# Patient Record
Sex: Female | Born: 2000
Health system: Southern US, Community
[De-identification: ages and names within clinical notes are randomized; demographics above are authoritative.]

## PROBLEM LIST (undated history)

## (undated) HISTORY — PX: NO PAST SURGERIES: SHX2092

---
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
## 2020-01-29 ENCOUNTER — Telehealth: Payer: Self-pay | Admitting: Family Medicine

## 2020-01-29 NOTE — Telephone Encounter (Signed)
Pt had Medical Records CD at front and never picked up  I mailed it to home address

## 2020-02-03 MED FILL — DROSPIRENONE-EE 3-0.02 MG T: 3-0.02 | 84 days supply | Qty: 84 | Fill #1

## 2020-04-14 ENCOUNTER — Ambulatory Visit (HOSPITAL_BASED_OUTPATIENT_CLINIC_OR_DEPARTMENT_OTHER)
Admission: RE | Admit: 2020-04-14 | Discharge: 2020-04-14 | Disposition: A | Discharge status: Home / Self Care | Payer: Managed Care, Other (non HMO) | Source: Ambulatory Visit | Attending: Medical | Admitting: Medical

## 2020-04-14 ENCOUNTER — Encounter: Payer: Self-pay | Admitting: Medical

## 2020-04-14 ENCOUNTER — Other Ambulatory Visit: Payer: Self-pay

## 2020-04-14 ENCOUNTER — Ambulatory Visit: Payer: Managed Care, Other (non HMO) | Admitting: Medical

## 2020-04-14 VITALS — BP 133/71 | HR 90 | Ht 64.0 in | Wt 159.0 lb

## 2020-04-14 DIAGNOSIS — R1032 Left lower quadrant pain: Secondary | ICD-10-CM | POA: Diagnosis not present

## 2020-04-14 DIAGNOSIS — K5900 Constipation, unspecified: Secondary | ICD-10-CM | POA: Diagnosis not present

## 2020-04-14 DIAGNOSIS — R112 Nausea with vomiting, unspecified: Secondary | ICD-10-CM | POA: Diagnosis not present

## 2020-04-14 LAB — CBC WITH DIFFERENTIAL/PLATELET
Basophils Absolute: 0 10*3/uL (ref 0.0–0.1)
Basophils Relative: 0.4 % (ref 0.0–3.0)
Eosinophils Absolute: 0.1 10*3/uL (ref 0.0–0.7)
Eosinophils Relative: 0.9 % (ref 0.0–5.0)
HCT: 38.8 % (ref 36.0–49.0)
Hemoglobin: 12.9 g/dL (ref 12.0–16.0)
Lymphocytes Relative: 37.3 % (ref 24.0–48.0)
Lymphs Abs: 2.4 10*3/uL (ref 0.7–4.0)
MCHC: 33.4 g/dL (ref 31.0–37.0)
MCV: 80.2 fl (ref 78.0–98.0)
Monocytes Absolute: 0.5 10*3/uL (ref 0.1–1.0)
Monocytes Relative: 7.6 % (ref 3.0–12.0)
Neutro Abs: 3.5 10*3/uL (ref 1.4–7.7)
Neutrophils Relative %: 53.8 % (ref 43.0–71.0)
Platelets: 382 10*3/uL (ref 150.0–575.0)
RBC: 4.83 Mil/uL (ref 3.80–5.70)
RDW: 13.4 % (ref 11.4–15.5)
WBC: 6.5 10*3/uL (ref 4.5–13.5)

## 2020-04-14 LAB — POC URINALSYSI DIPSTICK (AUTOMATED)
Bilirubin, UA: NEGATIVE
Blood, UA: POSITIVE
Glucose, UA: NEGATIVE
Ketones, UA: NEGATIVE
Leukocytes, UA: NEGATIVE
Nitrite, UA: POSITIVE
Protein, UA: NEGATIVE
Spec Grav, UA: 1.025 (ref 1.010–1.025)
Urobilinogen, UA: 0.2 E.U./dL
pH, UA: 6 (ref 5.0–8.0)

## 2020-04-14 LAB — COMPREHENSIVE METABOLIC PANEL
ALT: 9 U/L (ref 0–35)
AST: 12 U/L (ref 0–37)
Albumin: 4.2 g/dL (ref 3.5–5.2)
Alkaline Phosphatase: 82 U/L (ref 47–119)
BUN: 12 mg/dL (ref 6–23)
CO2: 27 mEq/L (ref 19–32)
Calcium: 9.6 mg/dL (ref 8.4–10.5)
Chloride: 103 mEq/L (ref 96–112)
Creatinine, Ser: 0.76 mg/dL (ref 0.40–1.20)
GFR: 113.21 mL/min (ref >60.00)
Glucose, Bld: 86 mg/dL (ref 70–99)
Potassium: 4.5 mEq/L (ref 3.5–5.1)
Sodium: 137 mEq/L (ref 135–145)
Total Bilirubin: 0.2 mg/dL (ref 0.2–1.2)
Total Protein: 7.4 g/dL (ref 6.0–8.3)

## 2020-04-14 LAB — POCT URINE PREGNANCY: Preg Test, Ur: NEGATIVE

## 2020-04-14 LAB — LIPASE: Lipase: 11 U/L (ref 11.0–59.0)

## 2020-04-14 MED ORDER — ONDANSETRON 4 MG PO TBDP: 4.0000 mg | ORAL_TABLET | Freq: Three times a day (TID) | ORAL | 0 refills | Status: DC | PRN

## 2020-04-14 NOTE — Patient Instructions (Addendum)
You have hx of constipation pattern intermixed with loose stools. Recent llq area pain. We got ua today with pregnancy test.(result was negative.) Will also get cbc, cmp, lipase and abdomen xray.  Presently recommend hydrate well, daily mild exercise if not worsening your pain, use metamucil 1 tablespoon in 8 oz of water three times daily.   If xray shows large stool burden left side colon area may advise dulcolaxl, miralax or mag citrate.   Rx zofran if you get nausea.  Will need to follow labs and if initial work up not revealing cause might need to do either [place] to evlauate ovary or ct abd/pelvis.  Follow up 7 days or as needed  Any severe change in signs and symptoms then recommend ED evaluation.

## 2020-04-14 NOTE — Progress Notes (Signed)
Subjective:    Patient ID: Alice Gonzalez, female    DOB: [birth year], 19 y.o.   MRN: [id number]  HPI Pt in for left lower abdomen for about 4 weeks off and on. Pain is not constant. Tends to be present after eating. Pt states symptoms like cramping pain. Pain has stopped all fried foods and carbonated beverages. But still has pain after eating. Last night had pain for 3 hours after eating salad and sweet potatoes. Pt states has some constipation mixed with diarrhea. For example recently had period of no bm for 7 days followed by loose stools for 3 days.  Pt last bm was 4 days ago.   No fever, no chills, sweats or bodyaches.  Pt states this is new pain presently. But did have some bowel movement issues.She states tends not to have infrequent bowel movement. She states usually only has bm every 2-3 days.  Pt drinks 3-4 bottles of water daily. Pt walks daily about 1.5 miles a day. Pt thinks maybe drinks too many processed carbohydrates. She is a hair stylist. Does not eat much during the day and then eat a lot at night.  Pt states cramps or so bad will vomit. Total 4 times in past month. Vomit one time 3 nights ago.  LMP- 03-15-2020. Pt expects to start menses tomorrow.    Review of Systems  Constitutional: Negative for chills, fatigue and fever.  Respiratory: Negative for cough, shortness of breath and wheezing.   Cardiovascular: Negative for chest pain and palpitations.  Gastrointestinal: Positive for abdominal pain, constipation, diarrhea, nausea and vomiting.       See hpi  Musculoskeletal: Negative for back pain.  Skin: Negative for rash.  Neurological: Negative for dizziness, syncope, weakness, numbness and headaches.  Hematological: Negative for adenopathy. Does not bruise/bleed easily.  Psychiatric/Behavioral: Negative for behavioral problems and decreased concentration.   No past medical history on file.   Social History   Socioeconomic History  . Marital status: Single     Spouse name: Not on file  . Number of children: 0  . Years of education: HS  . Highest education level: Not on file  Occupational History    Employer: OTHER    Comment: [employer]  Tobacco Use  . Smoking status: Never Smoker  . Smokeless tobacco: Never Used  Substance and Sexual Activity  . Alcohol use: No    Alcohol/week: 0.0 standard drinks  . Drug use: No  . Sexual activity: Not on file  Other Topics Concern  . Not on file  Social History Narrative   Patient lives at home with family.   Caffeine Use: 1 cup weekly   Social Determinants of Health   Financial Resource Strain:   . Difficulty of Paying Living Expenses: Not on file  Food Insecurity:   . Worried About Programme researcher, broadcasting/film/video in the Last Year: Not on file  . Ran Out of Food in the Last Year: Not on file  Transportation Needs:   . Lack of Transportation (Medical): Not on file  . Lack of Transportation (Non-Medical): Not on file  Physical Activity:   . Days of Exercise per Week: Not on file  . Minutes of Exercise per Session: Not on file  Stress:   . Feeling of Stress : Not on file  Social Connections:   . Frequency of Communication with Friends and Family: Not on file  . Frequency of Social Gatherings with Friends and Family: Not on file  . Attends  Religious Services: Not on file  . Active Member of Clubs or Organizations: Not on file  . Attends Banker Meetings: Not on file  . Marital Status: Not on file  Intimate Partner Violence:   . Fear of Current or Ex-Partner: Not on file  . Emotionally Abused: Not on file  . Physically Abused: Not on file  . Sexually Abused: Not on file    Past Surgical History:  Procedure Laterality Date  . NO PAST SURGERIES      Family History  Problem Relation Age of Onset  . Hypertension Maternal Grandmother   . Diabetes Maternal Grandmother   . High Cholesterol Maternal Grandmother   . Hypertension Maternal Grandfather   . Diabetes Maternal Grandfather    . High Cholesterol Maternal Grandfather     No Known Allergies  Current Outpatient Medications on File Prior to Visit  Medication Sig Dispense Refill  . amoxicillin (AMOXIL) 500 MG capsule Take 2 capsules (1,000 mg total) by mouth 2 (two) times daily. (Patient not taking: Reported on 04/14/2020) 40 capsule 0  . drospirenone-ethinyl estradiol (LORYNA) 3-0.02 MG tablet Take 1 tablet by mouth daily. 84 tablet 3  . ISOtretinoin (ACCUTANE) 30 MG capsule Take 30 mg by mouth 2 (two) times daily. (Patient not taking: Reported on 04/14/2020)     No current facility-administered medications on file prior to visit.    BP 133/71   Pulse 90   Ht 5\' 4"  (1.626 m)   Wt 159 lb (72.1 kg)   LMP 03/14/2020   SpO2 99%   BMI 27.29 kg/m       Objective:   Physical Exam   General Appearance- Not in acute distress.  HEENT Eyes- Scleraeral/Conjuntiva-bilat- Not Yellow. Mouth & Throat- Normal.  Chest and Lung Exam Auscultation: Breath sounds:-Normal. Adventitious sounds:- No Adventitious sounds.  Cardiovascular Auscultation:Rythm - Regular. Heart Sounds -Normal heart sounds.  Abdomen Inspection:-Inspection Normal.  Palpation/Perucssion: Palpation and Percussion of the abdomen reveal- faint left lower quadrant/adnexal areaTender, No Rebound tenderness, No rigidity(Guarding) and No Palpable abdominal masses.  Liver:-Normal.  Spleen:- Normal.   Back- no cva area tenderness.     Assessment & Plan:  You have hx of constipation pattern intermixed with loose stools. Recent llq area pain. We got ua today with pregnancy test.(result was negative.) Will also get cbc, cmp, lipase and abdomen xray.  Presently recommend hydrate well, daily mild exercise if not worsening your pain, use metamucil 1 tablespoon in 8 oz of water three times daily.   If xray shows large stool burden left side colon area may advise dulcolax, miralax or mag citrate.   Rx zofran if you get nausea.  Will need to follow  labs and if initial work up not revealing cause might need to do either 13/11/2019 to evlauate ovary or ct abd/pelvis.  Follow up 7 days or as needed  Any severe change in signs and symptoms then recommend ED evaluation.

## 2020-04-16 ENCOUNTER — Other Ambulatory Visit (HOSPITAL_BASED_OUTPATIENT_CLINIC_OR_DEPARTMENT_OTHER): Payer: Self-pay | Admitting: Medical

## 2020-04-16 ENCOUNTER — Telehealth: Payer: Self-pay | Admitting: Medical

## 2020-04-16 ENCOUNTER — Encounter: Payer: Self-pay | Admitting: Medical

## 2020-04-16 LAB — URINE CULTURE
MICRO NUMBER:: 11292299
SPECIMEN QUALITY:: ADEQUATE

## 2020-04-16 MED ORDER — CEPHALEXIN 500 MG PO CAPS: 500.0000 mg | ORAL_CAPSULE | Freq: Two times a day (BID) | ORAL | 0 refills | Status: DC

## 2020-04-16 MED FILL — CEPHALEXIN 500 MG CAPSULE: 500 | 10 days supply | Qty: 20 | Fill #0

## 2020-04-16 NOTE — Telephone Encounter (Signed)
Rx keflex sent to pt pharmacy.  

## 2020-04-18 NOTE — Progress Notes (Signed)
[facility] at [facility] [address], [address] [place], [place] [address] [phone]  Date:  04/21/2020   Name:  Alice Gonzalez   DOB:  August 19, 2000   MRN:  [id number]  PCP:  Pearline Cables, MD    Chief Complaint: Abdominal Pain (One week follow up-saw edward last week, no improvement, lower left abdominal pain//)   History of Present Illness:  Alice Gonzalez is a 19 y.o. very pleasant female patient who presents with the following:  Short term follow-up visit today Last seen by Ramon Dredge on 12/8 with concern of lower abd pain BW reassuring, she had a positive E coli UTI - treated with keflex  Negative HCG, normal labs and plain abd film at her last visit  She did not get better after she took the abx unfortunately-she notes she has had left lower quadrant abdominal pain for about 5 weeks now She continues to be constipated; she is not sure if this may be the reason for her pain She tried MiraLAX which did not help, she is also tried milk of magnesia which is more helpful No nausea but she may vomit on occasion if the pain is very severe Her last stool was Sunday pm- today is Wednesday This is unusual for her- she will generally go every other day at least  She is on OCP and does not suspect pregnancy, she is currently menstruating No fever  She is eating normally except she is avoiding sodas as they make her feel worse  She is generally in good health She is sexually active with one steady long-term partner  There are no problems to display for this patient.   History reviewed. No pertinent past medical history.  Past Surgical History:  Procedure Laterality Date  . NO PAST SURGERIES      Social History   Tobacco Use  . Smoking status: Never Smoker  . Smokeless tobacco: Never Used  Substance Use Topics  . Alcohol use: No    Alcohol/week: 0.0 standard drinks  . Drug use: No    Family History  Problem Relation Age of Onset   . Hypertension Maternal Grandmother   . Diabetes Maternal Grandmother   . High Cholesterol Maternal Grandmother   . Hypertension Maternal Grandfather   . Diabetes Maternal Grandfather   . High Cholesterol Maternal Grandfather     No Known Allergies  Medication list has been reviewed and updated.  Current Outpatient Medications on File Prior to Visit  Medication Sig Dispense Refill  . cephALEXin (KEFLEX) 500 MG capsule Take 1 capsule (500 mg total) by mouth 2 (two) times daily. 20 capsule 0  . drospirenone-ethinyl estradiol (LORYNA) 3-0.02 MG tablet Take 1 tablet by mouth daily. 84 tablet 3  . ondansetron (ZOFRAN ODT) 4 MG disintegrating tablet Take 1 tablet (4 mg total) by mouth every 8 (eight) hours as needed for nausea or vomiting. 20 tablet 0  . tretinoin (RETIN-A) 0.025 % cream Apply topically at bedtime.     No current facility-administered medications on file prior to visit.    Review of Systems:  As per HPI- otherwise negative.   Physical Examination: Vitals:   04/21/20 1321 04/21/20 1356  BP: 112/70   Pulse: (!) 113 86  Resp: 17   SpO2: 98%    Vitals:   04/21/20 1321  Weight: 159 lb (72.1 kg)  Height: 5\' 4"  (1.626 m)   Body mass index is 27.29 kg/m. Ideal Body  Weight: Weight in (lb) to have BMI = 25: 145.3  GEN: no acute distress.  Mild overweight, looks well  HEENT: Atraumatic, Normocephalic.  Ears and Nose: No external deformity. CV: RRR, No M/G/R. No JVD. No thrill. No extra heart sounds. PULM: CTA B, no wheezes, crackles, rhonchi. No retractions. No resp. distress. No accessory muscle use. ABD: S, NT, ND, +BS. No rebound. No HSM.  She has mild left lower quadrant tenderness on palpation EXTR: No c/c/e PSYCH: Normally interactive. Conversant.  Pelvic exam is normal.  Vagina normal, no cervical motion tenderness, no abnormal cervical discharge, no adnexal masses  Pulse Readings from Last 3 Encounters:  04/21/20 86  04/14/20 90  05/19/19 (!) 107    Results for orders placed or performed in visit on 04/21/20  POCT urine pregnancy  Result Value Ref Range   Preg Test, Ur Negative Negative    Assessment and Plan: Left lower quadrant abdominal pain - Plan: Urine Culture, Cervicovaginal ancillary only( [place]), POCT urine pregnancy, CT Abdomen Pelvis W Contrast, CANCELED: POCT urinalysis dipstick, CANCELED: CT Abdomen Pelvis W Contrast  Patient today with persistent left lower quadrant pain for several weeks.  She was seen last week and evaluated, she was found to have a UTI.  This was treated, but unfortunately she has not improved No evidence of PID on exam HCG negative Given pain for several weeks without any explanation, will plan to proceed to CT scan.  Patient is in agreement with this next step I ordered the CT stat but we were not able to get this approved through her insurance.  We will plan to have this done as soon as possible.  She will let me know if any worsening in the meantime This visit occurred during the SARS-CoV-2 public health emergency.  Safety protocols were in place, including screening questions prior to the visit, additional usage of staff PPE, and extensive cleaning of exam room while observing appropriate contact time as indicated for disinfecting solutions.    Signed Abbe Amsterdam, MD

## 2020-04-19 ENCOUNTER — Ambulatory Visit: Payer: 59 | Admitting: Family Medicine

## 2020-04-21 ENCOUNTER — Encounter: Payer: Self-pay | Admitting: Family Medicine

## 2020-04-21 ENCOUNTER — Other Ambulatory Visit: Payer: Self-pay

## 2020-04-21 ENCOUNTER — Ambulatory Visit: Payer: Managed Care, Other (non HMO) | Admitting: Medical

## 2020-04-21 ENCOUNTER — Ambulatory Visit: Payer: Managed Care, Other (non HMO) | Admitting: Family Medicine

## 2020-04-21 ENCOUNTER — Other Ambulatory Visit (HOSPITAL_COMMUNITY)
Admission: RE | Admit: 2020-04-21 | Discharge: 2020-04-21 | Disposition: A | Discharge status: Home / Self Care | Payer: Managed Care, Other (non HMO) | Source: Ambulatory Visit | Attending: Family Medicine | Admitting: Family Medicine

## 2020-04-21 VITALS — BP 112/70 | HR 86 | Resp 17 | Ht 64.0 in | Wt 159.0 lb

## 2020-04-21 DIAGNOSIS — R1032 Left lower quadrant pain: Secondary | ICD-10-CM | POA: Diagnosis not present

## 2020-04-21 DIAGNOSIS — Z113 Encounter for screening for infections with a predominantly sexual mode of transmission: Secondary | ICD-10-CM | POA: Diagnosis not present

## 2020-04-21 LAB — POCT URINE PREGNANCY: Preg Test, Ur: NEGATIVE

## 2020-04-22 LAB — URINE CULTURE
MICRO NUMBER:: 11321282
Result:: NO GROWTH
SPECIMEN QUALITY:: ADEQUATE

## 2020-04-23 ENCOUNTER — Encounter: Payer: Self-pay | Admitting: Family Medicine

## 2020-04-23 LAB — CERVICOVAGINAL ANCILLARY ONLY
Bacterial Vaginitis (gardnerella): NEGATIVE
Candida Glabrata: NEGATIVE
Candida Vaginitis: NEGATIVE
Chlamydia: NEGATIVE
Comment: NEGATIVE
Comment: NEGATIVE
Comment: NEGATIVE
Comment: NEGATIVE
Comment: NEGATIVE
Comment: NORMAL
Neisseria Gonorrhea: NEGATIVE
Trichomonas: NEGATIVE

## 2020-04-27 ENCOUNTER — Ambulatory Visit (HOSPITAL_BASED_OUTPATIENT_CLINIC_OR_DEPARTMENT_OTHER)
Admission: RE | Admit: 2020-04-27 | Discharge: 2020-04-27 | Disposition: A | Discharge status: Home / Self Care | Payer: Managed Care, Other (non HMO) | Source: Ambulatory Visit | Attending: Family Medicine | Admitting: Family Medicine

## 2020-04-27 ENCOUNTER — Encounter: Payer: Self-pay | Admitting: Family Medicine

## 2020-04-27 ENCOUNTER — Other Ambulatory Visit: Payer: Self-pay

## 2020-04-27 DIAGNOSIS — R1032 Left lower quadrant pain: Secondary | ICD-10-CM | POA: Diagnosis present

## 2020-04-27 MED ORDER — IOHEXOL 300 MG/ML  SOLN
100.0000 mL | Freq: Once | INTRAMUSCULAR | Status: AC | PRN
  Administered 2020-04-27: 80 mL via INTRAVENOUS

## 2020-05-31 MED FILL — DROSPIRENONE-EE 3-0.02 MG T: 3-0.02 | 84 days supply | Qty: 84 | Fill #2

## 2020-08-09 ENCOUNTER — Other Ambulatory Visit (HOSPITAL_BASED_OUTPATIENT_CLINIC_OR_DEPARTMENT_OTHER): Payer: Self-pay

## 2020-08-09 MED FILL — Drospirenone-Ethinyl Estradiol Tab 3-0.02 MG: ORAL | 84 days supply | Qty: 84 | Fill #0 | Status: CN

## 2020-08-16 ENCOUNTER — Other Ambulatory Visit (HOSPITAL_BASED_OUTPATIENT_CLINIC_OR_DEPARTMENT_OTHER): Payer: Self-pay

## 2020-08-27 NOTE — Progress Notes (Signed)
[facility] at [facility] [address], [address] [place], [place] [address] [phone]  Date:  08/30/2020   Name:  Alice Gonzalez   DOB:  11-07-2000   MRN:  [id number]  PCP:  Pearline Cables, MD    Chief Complaint: No chief complaint on file.   History of Present Illness:  Alice Gonzalez is a 20 y.o. very pleasant female patient who presents with the following:  Following up for a virtual visit today to discuss medication Last seen by myself in December - she was having some left lower quadrant pain at that time.  CT scan was negative  Pt location is her job, my location is office Patient identity confirmed with 2 factors, she gives consent for virtual visit today.  The patient myself are present for call today  She is taking OCP- she has been on her current pill for about 6 months if not more She began spotting the last 2 months- this is occurring a lot and is bothersome She continues to take her pill daily without missing pills No pain or cramping  She is SA at this time- same partner for 2 years She does take a pregnancy test on a regular basis to ensure she is not pregnant  She is using the [facility] pharmacy She is 2 weeks into her her current 28-day pack  Her current birth control pill is drospironone and 20mg  estradiol She has also used  Levonorgestrel with the same estradiol strength  Discussed with patient.  She would like to try a different birth control pill to see if this will control her unscheduled bleeding.  Otherwise she is feeling well and has no concerns except for seasonal allergies.  There are no problems to display for this patient.   No past medical history on file.  Past Surgical History:  Procedure Laterality Date  . NO PAST SURGERIES      Social History   Tobacco Use  . Smoking status: Never Smoker  . Smokeless tobacco: Never Used  Substance Use Topics  . Alcohol use: No    Alcohol/week: 0.0  standard drinks  . Drug use: No    Family History  Problem Relation Age of Onset  . Hypertension Maternal Grandmother   . Diabetes Maternal Grandmother   . High Cholesterol Maternal Grandmother   . Hypertension Maternal Grandfather   . Diabetes Maternal Grandfather   . High Cholesterol Maternal Grandfather     No Known Allergies  Medication list has been reviewed and updated.  Current Outpatient Medications on File Prior to Visit  Medication Sig Dispense Refill  . cephALEXin (KEFLEX) 500 MG capsule Take 1 capsule (500 mg total) by mouth 2 (two) times daily. 20 capsule 0  . cephALEXin (KEFLEX) 500 MG capsule TAKE 1 CAPSULE (500 MG TOTAL) BY MOUTH 2 (TWO) TIMES DAILY. 20 capsule 0  . drospirenone-ethinyl estradiol (LORYNA) 3-0.02 MG tablet Take 1 tablet by mouth daily. 84 tablet 3  . drospirenone-ethinyl estradiol (YAZ) 3-0.02 MG tablet TAKE 1 TABLET BY MOUTH DAILY. 84 tablet 3  . ondansetron (ZOFRAN ODT) 4 MG disintegrating tablet Take 1 tablet (4 mg total) by mouth every 8 (eight) hours as needed for nausea or vomiting. 20 tablet 0  . tretinoin (RETIN-A) 0.025 % cream Apply topically at bedtime.     No current facility-administered medications on file prior to visit.    Review of Systems:  As per HPI- otherwise negative.  Physical Examination: There were no vitals filed for this visit. There were no vitals filed for this visit. There is no height or weight on file to calculate BMI. Ideal Body Weight:    Patient observed over video monitor.  She looks well, her normal self No cough, shortness of breath, or distress is noted    Assessment and Plan: Encounter for surveillance of contraceptive pills - Plan: levonorgestrel-ethinyl estradiol (NORDETTE) 0.15-30 MG-MCG tablet  Seasonal allergic rhinitis due to pollen  Virtual visit today to discuss unscheduled bleeding/spotting with your contraceptive pill.  We will try changing it to a pill with a higher estrogen  component in hopes of controlling her bleeding-she will let me know how this works for her She will also take a home pregnancy test to ensure no chance of pregnancy I also suggested adding a nasal steroid spray to her current nonsedating antihistamine for allergic rhinitis  Video used for duration of visit today  Signed Abbe Amsterdam, MD

## 2020-08-30 ENCOUNTER — Telehealth (INDEPENDENT_AMBULATORY_CARE_PROVIDER_SITE_OTHER): Payer: Managed Care, Other (non HMO) | Admitting: Family Medicine

## 2020-08-30 ENCOUNTER — Other Ambulatory Visit: Payer: Self-pay

## 2020-08-30 ENCOUNTER — Encounter: Payer: Self-pay | Admitting: Family Medicine

## 2020-08-30 ENCOUNTER — Other Ambulatory Visit (HOSPITAL_BASED_OUTPATIENT_CLINIC_OR_DEPARTMENT_OTHER): Payer: Self-pay

## 2020-08-30 DIAGNOSIS — Z3041 Encounter for surveillance of contraceptive pills: Secondary | ICD-10-CM | POA: Diagnosis not present

## 2020-08-30 DIAGNOSIS — J301 Allergic rhinitis due to pollen: Secondary | ICD-10-CM

## 2020-08-30 MED ORDER — LEVONORGESTREL-ETHINYL ESTRAD 0.15-30 MG-MCG PO TABS
1.0000 | ORAL_TABLET | Freq: Every day | ORAL | 3 refills | Status: DC
  Filled 2020-08-30: qty 84, 84d supply, fill #0

## 2020-09-01 ENCOUNTER — Other Ambulatory Visit (HOSPITAL_BASED_OUTPATIENT_CLINIC_OR_DEPARTMENT_OTHER): Payer: Self-pay

## 2020-12-02 ENCOUNTER — Other Ambulatory Visit (HOSPITAL_BASED_OUTPATIENT_CLINIC_OR_DEPARTMENT_OTHER): Payer: Self-pay

## 2021-06-22 ENCOUNTER — Ambulatory Visit: Payer: Managed Care, Other (non HMO) | Admitting: Family Medicine

## 2021-06-22 VITALS — BP 118/74 | HR 74 | Temp 97.9°F | Resp 18 | Ht 64.0 in | Wt 173.6 lb

## 2021-06-22 DIAGNOSIS — Z131 Encounter for screening for diabetes mellitus: Secondary | ICD-10-CM | POA: Diagnosis not present

## 2021-06-22 DIAGNOSIS — Z130 Encounter for screening for diseases of the blood and blood-forming organs and certain disorders involving the immune mechanism: Secondary | ICD-10-CM | POA: Diagnosis not present

## 2021-06-22 DIAGNOSIS — R5383 Other fatigue: Secondary | ICD-10-CM | POA: Diagnosis not present

## 2021-06-22 DIAGNOSIS — Z13220 Encounter for screening for lipoid disorders: Secondary | ICD-10-CM | POA: Diagnosis not present

## 2021-06-22 DIAGNOSIS — Z1329 Encounter for screening for other suspected endocrine disorder: Secondary | ICD-10-CM

## 2021-06-22 DIAGNOSIS — R109 Unspecified abdominal pain: Secondary | ICD-10-CM | POA: Diagnosis not present

## 2021-06-22 LAB — POCT URINE PREGNANCY: Preg Test, Ur: NEGATIVE

## 2021-06-22 NOTE — Patient Instructions (Signed)
It was great to see you again today.  I will be in touch with your labs soon as possible Signs of gallbladder symptoms may include pain in the right upper abdomen, especially after eating a greasy meal.  If you notice this pattern let me know and I will set you up for an ultrasound  I hope once you are pregnant your pain will lessen (since no periods!) However if no luck in about 9 months of trying I would suggest having you see GYN in case you may have an issue like endometriosis   Fertility awareness methods like checking your morning temperature and monitoring your cervical mucus, and using an ovulation predictor can also be helpful and interesting!   Please let me know if your pain is getting worse at all Recommend start pre-natal vitamins now

## 2021-06-22 NOTE — Progress Notes (Addendum)
[facility] [place], [place], [place] [address] [phone]  Date:  06/22/2021   Name:  Alice Gonzalez   DOB:  December 07, 2000   MRN:  [id number]  PCP:  Darreld Mclean, MD    Chief Complaint: Abdominal Pain (Pt says she has been off of her birth control for about 4 months now and correlates the two.) and Follow-up (Concerns/ questions: abd pain/HIV/ Hep C Screen)   History of Present Illness:  Alice Gonzalez is a 21 y.o. very pleasant female patient who presents with the following:  Pt has noted "on again off again" abd pain She has noted this for about 4 months but worse for the last 2 months Seems worse since she came off her OCP for months ago She will notice it about a week prior to her menses- seems to go away after her menses stop She did have a lot of cramping and pain, heavy bleeding   She got married in December and they are casually trying to become pregnant -therefore she does not wish to go back on contraception at this time She may get nauseated but no vomiting Her cycles are regular- she is actually bleeding right now   1/14- 1/19 Current MP 2/11-current  She has not noted a pattern of pain to suggest gallbladder disease which we discussed today  There are no problems to display for this patient.   No past medical history on file.  Past Surgical History:  Procedure Laterality Date   NO PAST SURGERIES      Social History   Tobacco Use   Smoking status: Never   Smokeless tobacco: Never  Substance Use Topics   Alcohol use: No    Alcohol/week: 0.0 standard drinks   Drug use: No    Family History  Problem Relation Age of Onset   Hypertension Maternal Grandmother    Diabetes Maternal Grandmother    High Cholesterol Maternal Grandmother    Hypertension Maternal Grandfather    Diabetes Maternal Grandfather    High Cholesterol Maternal Grandfather     No Known Allergies  Medication  list has been reviewed and updated.  No current outpatient medications on file prior to visit.   No current facility-administered medications on file prior to visit.    Review of Systems:  As per HPI- otherwise negative.    Physical Examination: Vitals:   06/22/21 1525  BP: 118/74  Pulse: 74  Resp: 18  Temp: 97.9 F (36.6 C)  SpO2: 100%   Vitals:   06/22/21 1525  Weight: 173 lb 9.6 oz (78.7 kg)  Height: 5\' 4"  (1.626 m)   Body mass index is 29.8 kg/m. Ideal Body Weight: Weight in (lb) to have BMI = 25: 145.3  GEN: no acute distress.  Overweight, looks well HEENT: Atraumatic, Normocephalic.  Ears and Nose: No external deformity. CV: RRR, No M/G/R. No JVD. No thrill. No extra heart sounds. PULM: CTA B, no wheezes, crackles, rhonchi. No retractions. No resp. distress. No accessory muscle use. ABD: S, NT, ND, +BS. No rebound. No HSM.  Negative Murphy sign EXTR: No c/c/e PSYCH: Normally interactive. Conversant.   Results for orders placed or performed in visit on 06/22/21  POCT urine pregnancy  Result Value Ref Range   Preg Test, Ur Negative Negative    Assessment and Plan: Screening for deficiency anemia - Plan: CBC  Screening for diabetes mellitus - Plan: Comprehensive metabolic panel,  Hemoglobin A1c  Screening for thyroid disorder - Plan: TSH  Abdominal pain, unspecified abdominal location - Plan: POCT urine pregnancy  Screening for hyperlipidemia - Plan: Lipid panel  Fatigue, unspecified type - Plan: TSH, VITAMIN D 25 Hydroxy (Vit-D Deficiency, Fractures)  Patient seen today with concern of abdominal pain.  She noted onset of pain when she came off her oral contraceptive in anticipation of trying to conceive.  She does note painful and heavy periods when she was younger.  For that time being she can deal with her symptoms, does not wish to go back on contraceptives.  Labs are pending as above, we also discussed doing a right upper quadrant ultrasound to  evaluate her gallbladder.  For the time being she declines ultrasound but will monitor for digestive symptoms.  We also discussed possibility of endometriosis-if she does not become pregnant within about 9 months would suggest having her see gynecology.  I also recommended she start prenatal vitamins now  Will plan further follow- up pending labs.  Signed Lamar Blinks, MD  Addendum 2/16, received her labs as below.  Message to patient  Results for orders placed or performed in visit on 06/22/21  CBC  Result Value Ref Range   WBC 7.2 4.0 - 10.5 K/uL   RBC 4.78 3.87 - 5.11 Mil/uL   Platelets 448.0 (H) 150.0 - 400.0 K/uL   Hemoglobin 12.4 12.0 - 15.0 g/dL   HCT 38.8 36.0 - 46.0 %   MCV 81.1 78.0 - 100.0 fl   MCHC 32.1 30.0 - 36.0 g/dL   RDW 14.3 11.5 - 15.5 %  Comprehensive metabolic panel  Result Value Ref Range   Sodium 138 135 - 145 mEq/L   Potassium 4.5 3.5 - 5.1 mEq/L   Chloride 102 96 - 112 mEq/L   CO2 33 (H) 19 - 32 mEq/L   Glucose, Bld 81 70 - 99 mg/dL   BUN 10 6 - 23 mg/dL   Creatinine, Ser 0.65 0.40 - 1.20 mg/dL   Total Bilirubin 0.3 0.2 - 1.2 mg/dL   Alkaline Phosphatase 86 39 - 117 U/L   AST 21 0 - 37 U/L   ALT 31 0 - 35 U/L   Total Protein 7.4 6.0 - 8.3 g/dL   Albumin 4.4 3.5 - 5.2 g/dL   GFR 126.14 >60.00 mL/min   Calcium 9.5 8.4 - 10.5 mg/dL  Hemoglobin A1c  Result Value Ref Range   Hgb A1c MFr Bld 5.5 4.6 - 6.5 %  Lipid panel  Result Value Ref Range   Cholesterol 179 0 - 200 mg/dL   Triglycerides 278.0 (H) 0.0 - 149.0 mg/dL   HDL 41.90 >39.00 mg/dL   VLDL 55.6 (H) 0.0 - 40.0 mg/dL   Total CHOL/HDL Ratio 4    NonHDL 137.17   TSH  Result Value Ref Range   TSH 1.46 0.35 - 5.50 uIU/mL  VITAMIN D 25 Hydroxy (Vit-D Deficiency, Fractures)  Result Value Ref Range   VITD 22.07 (L) 30.00 - 100.00 ng/mL  LDL cholesterol, direct  Result Value Ref Range   Direct LDL 109.0 mg/dL  POCT urine pregnancy  Result Value Ref Range   Preg Test, Ur Negative  Negative

## 2021-06-23 ENCOUNTER — Encounter: Payer: Self-pay | Admitting: Family Medicine

## 2021-06-23 LAB — HEMOGLOBIN A1C: Hgb A1c MFr Bld: 5.5 % (ref 4.6–6.5)

## 2021-06-23 LAB — COMPREHENSIVE METABOLIC PANEL
ALT: 31 U/L (ref 0–35)
AST: 21 U/L (ref 0–37)
Albumin: 4.4 g/dL (ref 3.5–5.2)
Alkaline Phosphatase: 86 U/L (ref 39–117)
BUN: 10 mg/dL (ref 6–23)
CO2: 33 mEq/L — ABNORMAL HIGH (ref 19–32)
Calcium: 9.5 mg/dL (ref 8.4–10.5)
Chloride: 102 mEq/L (ref 96–112)
Creatinine, Ser: 0.65 mg/dL (ref 0.40–1.20)
GFR: 126.14 mL/min (ref >60.00)
Glucose, Bld: 81 mg/dL (ref 70–99)
Potassium: 4.5 mEq/L (ref 3.5–5.1)
Sodium: 138 mEq/L (ref 135–145)
Total Bilirubin: 0.3 mg/dL (ref 0.2–1.2)
Total Protein: 7.4 g/dL (ref 6.0–8.3)

## 2021-06-23 LAB — LIPID PANEL
Cholesterol: 179 mg/dL (ref 0–200)
HDL: 41.9 mg/dL (ref >39.00)
NonHDL: 137.17
Total CHOL/HDL Ratio: 4
Triglycerides: 278 mg/dL — ABNORMAL HIGH (ref 0.0–149.0)
VLDL: 55.6 mg/dL — ABNORMAL HIGH (ref 0.0–40.0)

## 2021-06-23 LAB — VITAMIN D 25 HYDROXY (VIT D DEFICIENCY, FRACTURES): VITD: 22.07 ng/mL — ABNORMAL LOW (ref 30.00–100.00)

## 2021-06-23 LAB — CBC
HCT: 38.8 % (ref 36.0–46.0)
Hemoglobin: 12.4 g/dL (ref 12.0–15.0)
MCHC: 32.1 g/dL (ref 30.0–36.0)
MCV: 81.1 fl (ref 78.0–100.0)
Platelets: 448 10*3/uL — ABNORMAL HIGH (ref 150.0–400.0)
RBC: 4.78 Mil/uL (ref 3.87–5.11)
RDW: 14.3 % (ref 11.5–15.5)
WBC: 7.2 10*3/uL (ref 4.0–10.5)

## 2021-06-23 LAB — TSH: TSH: 1.46 u[IU]/mL (ref 0.35–5.50)

## 2021-06-23 LAB — LDL CHOLESTEROL, DIRECT: Direct LDL: 109 mg/dL

## 2021-08-29 IMAGING — DX DG ABDOMEN 1V
2 series · 2 of 2 positions shown · non-contrast
Comparison: None.

CLINICAL DATA: Abdominal pain with episodes of constipation. Left
lower quadrant abdominal pain for 4 weeks.

EXAM:
ABDOMEN - 1 VIEW

[abdomen kub (1 of 2)]
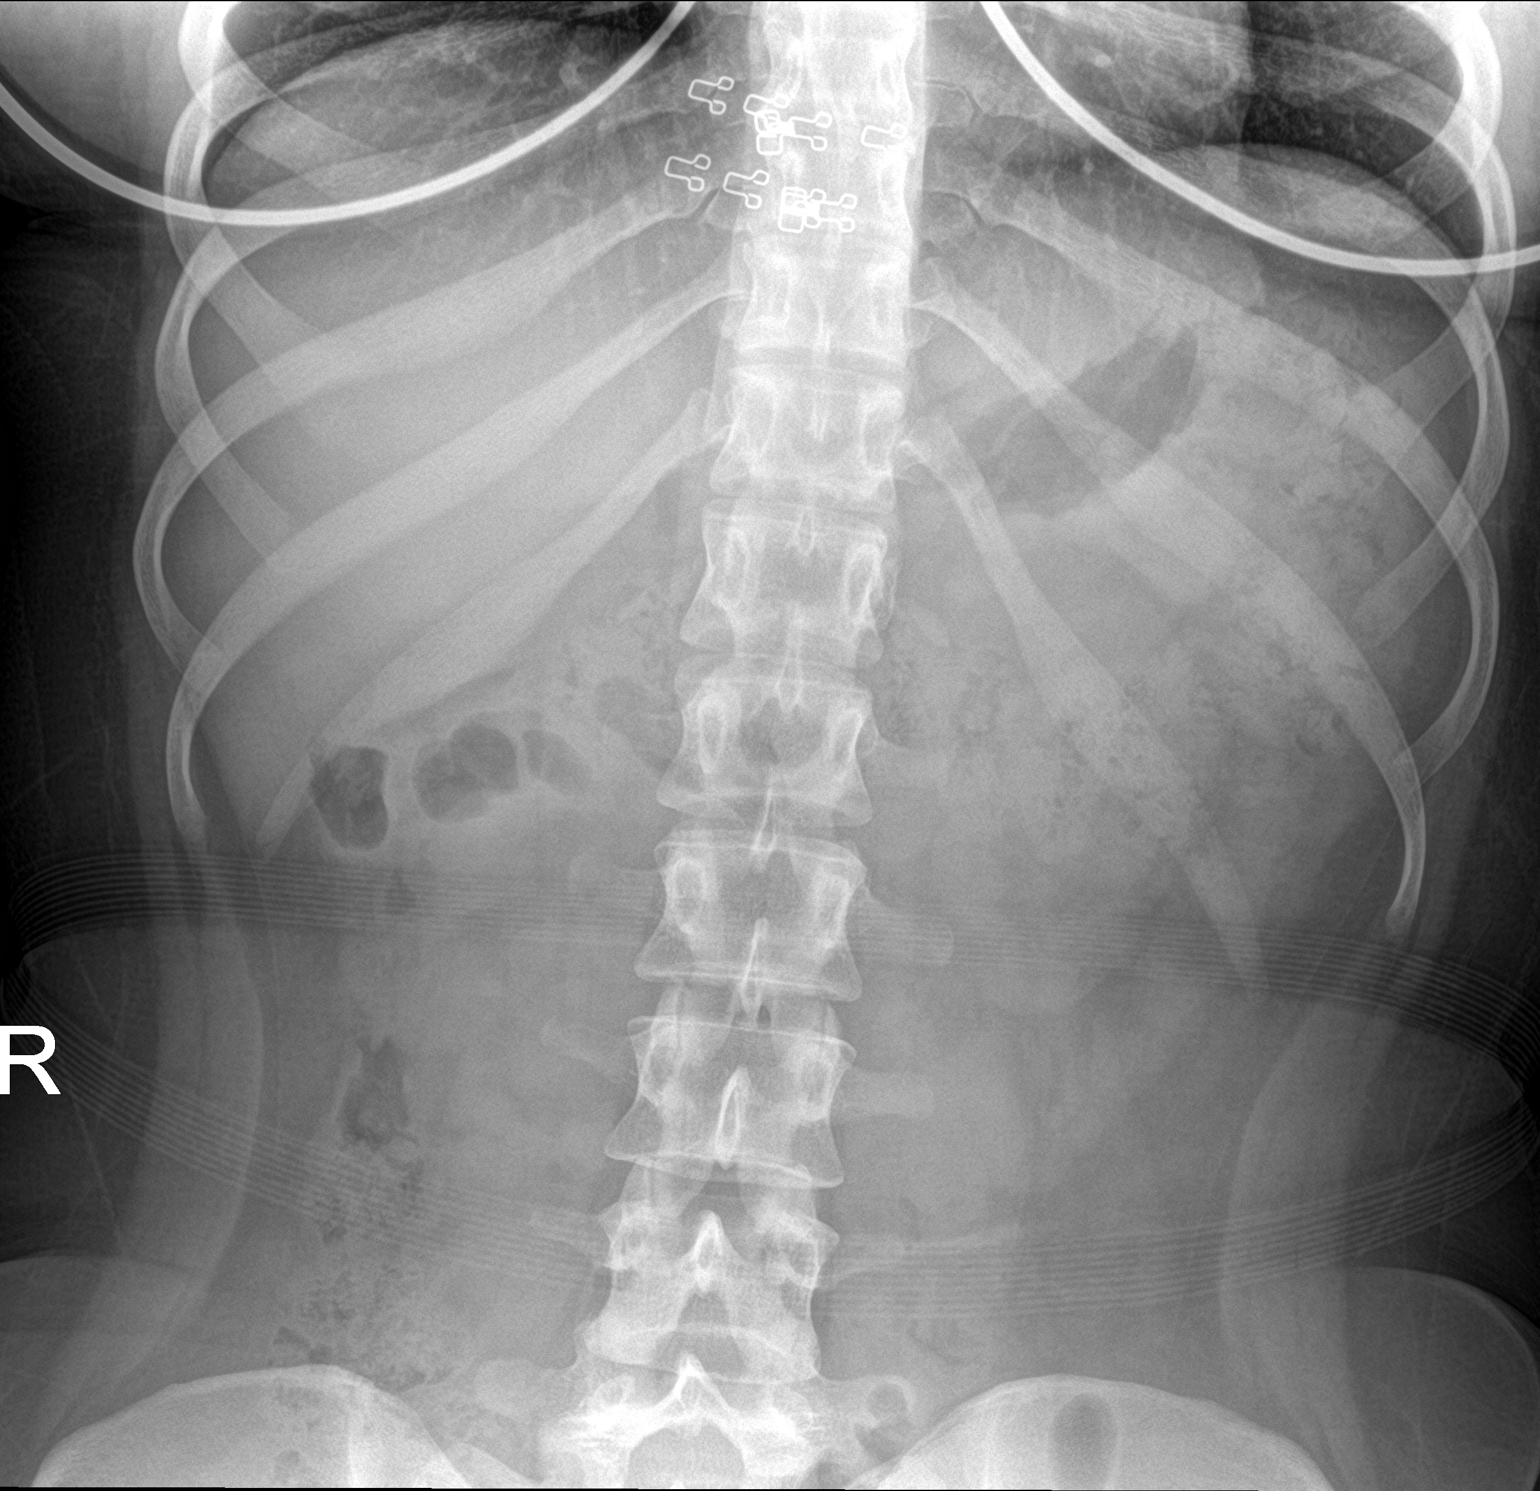

[abdomen kub (2 of 2)]
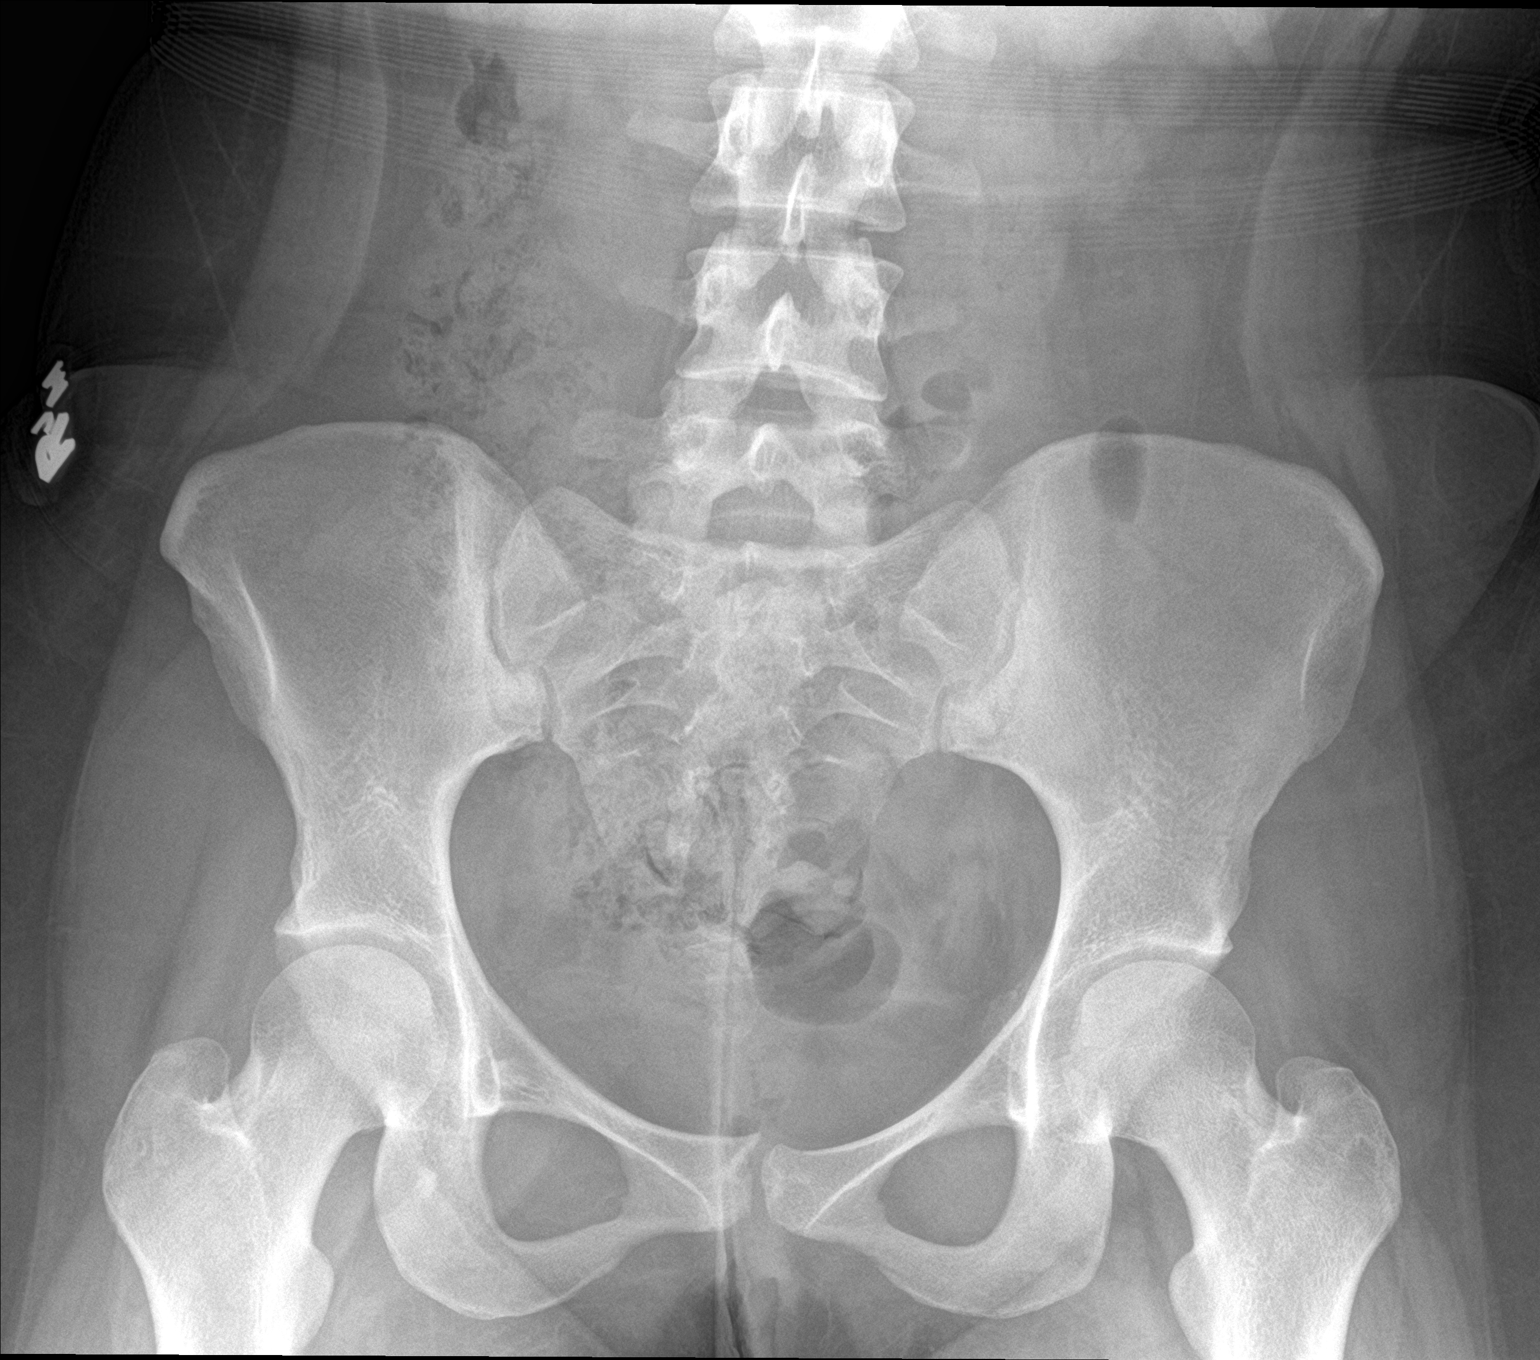

[2 of 2 positions shown; findings below may reference images not displayed]

FINDINGS: Normal bowel gas pattern. No bowel dilatation to suggest
obstruction. Small volume of stool in the ascending colon. Moderate
stool in the transverse and proximal descending colon. Small volume
of stool in the sigmoid. No radiopaque calculi or abnormal soft
tissue calcifications. No concerning intraabdominal mass effect.
Included lung bases are clear. No osseous abnormalities are seen.
Six non-rib-bearing lumbar vertebra versus diminutive ribs at T12,
incidental.
IMPRESSION: Normal bowel gas pattern. Small to moderate colonic stool burden. No
radiographic findings to explain pain.

## 2021-09-11 IMAGING — CT CT ABD-PELV W/ CM
2 of 4 series · 16 of 46 positions shown, 18 images · IV contrast (Omnipaque)
Comparison: Ultrasound abdomen 03/01/2016, x-ray abdomen 04/14/2020

CLINICAL DATA: Left lower quadrant pain X 4 weeks with
constipation.

EXAM:
CT ABDOMEN AND PELVIS WITH CONTRAST
TECHNIQUE: Multidetector CT imaging of the abdomen and pelvis was performed
using the standard protocol following bolus administration of
intravenous contrast.
CONTRAST:  80mL OMNIPAQUE IOHEXOL 300 MG/ML  SOLN

[Series 2: axial st · axial · 0.68mm/px · z∈[+744,+1154]mm · 13 of 90 slices shown, 15 images]
[im 4/90  soft-tissue]
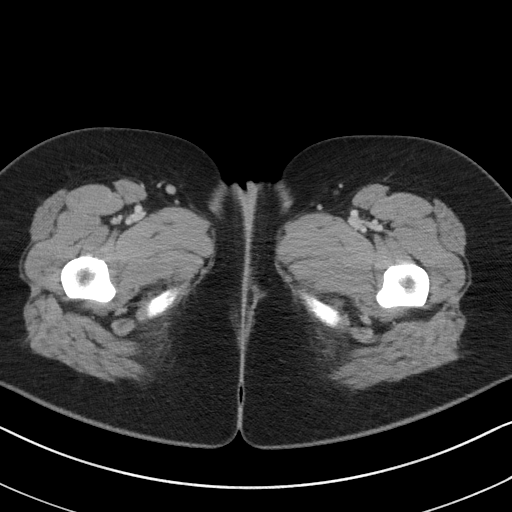
[im 4/90  bone]
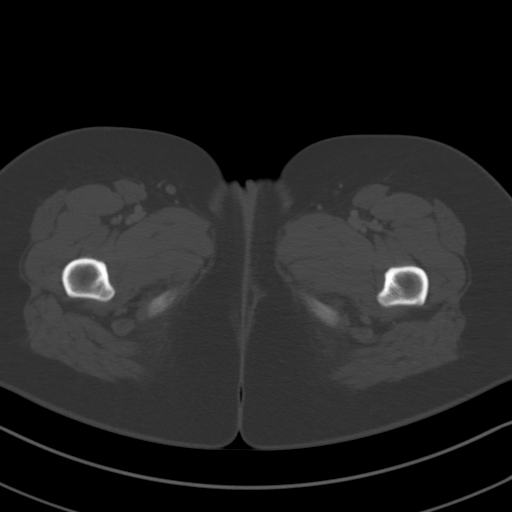
[im 11/90  soft-tissue]
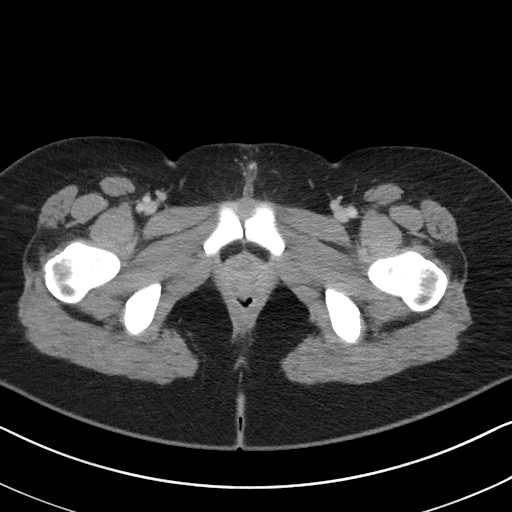
[im 18/90  soft-tissue]
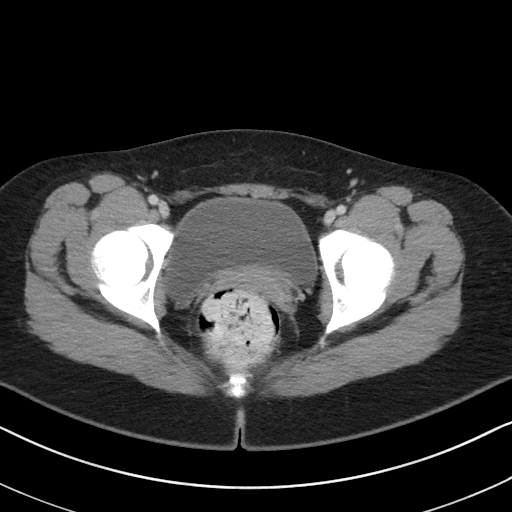
[im 24/90  soft-tissue]
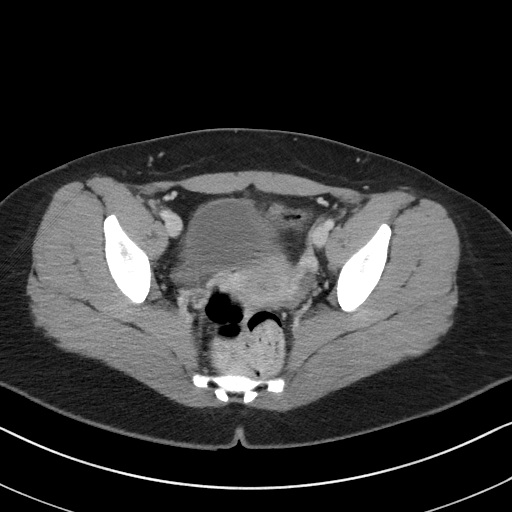
[im 31/90  soft-tissue]
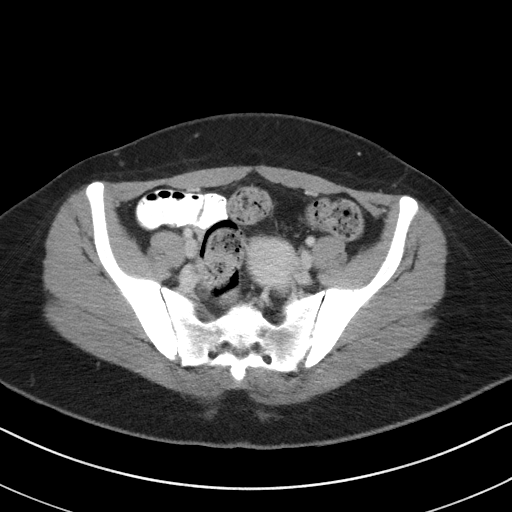
[im 38/90  soft-tissue]
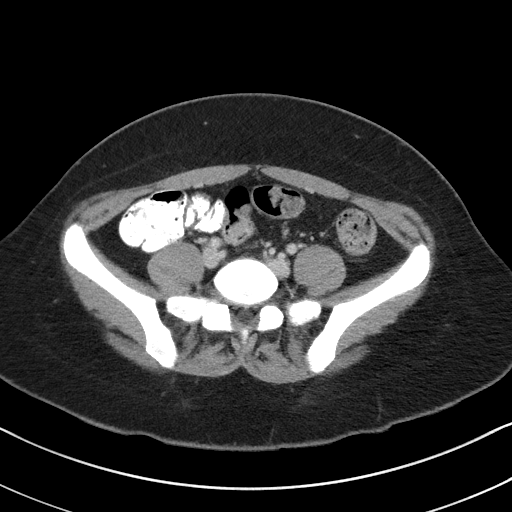
[im 45/90  soft-tissue]
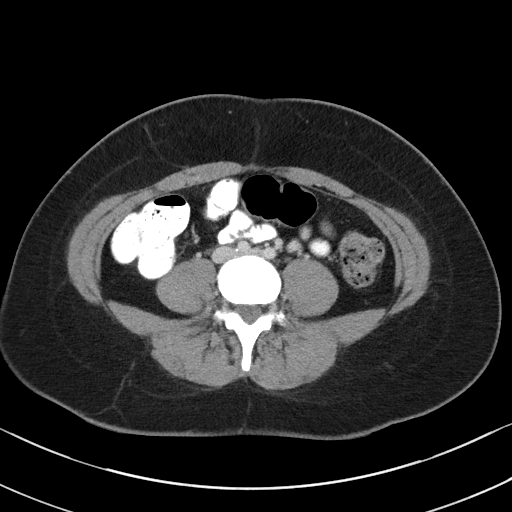
[im 52/90  soft-tissue]
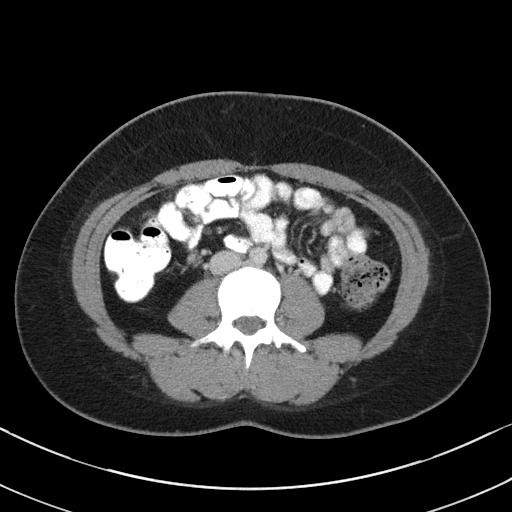
[im 59/90  soft-tissue]
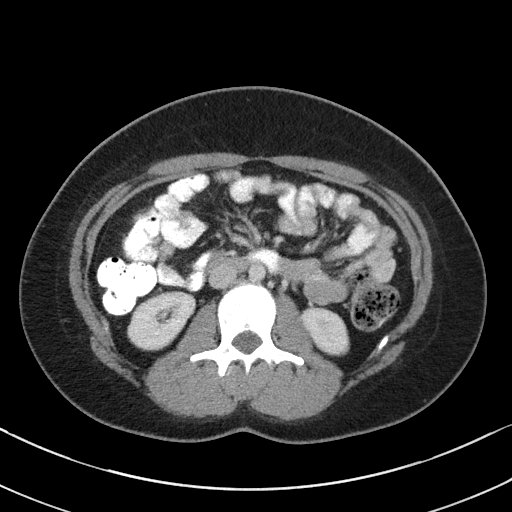
[im 59/90  bone]
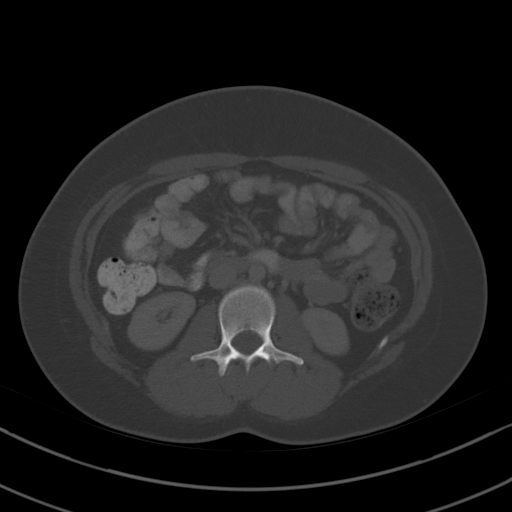
[im 66/90  soft-tissue]
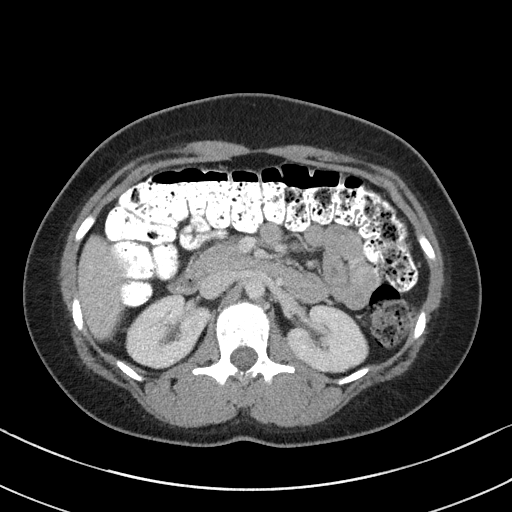
[im 72/90  soft-tissue]
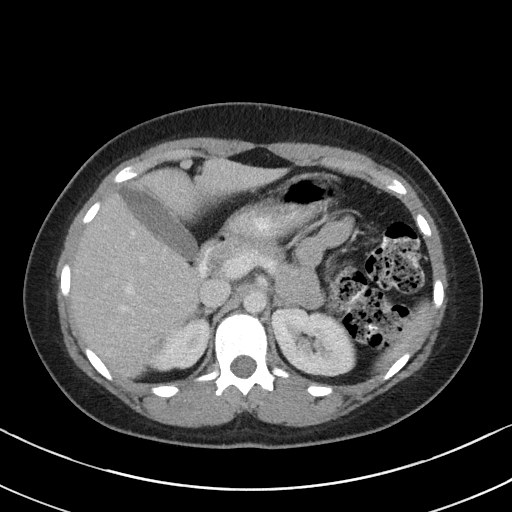
[im 79/90  soft-tissue]
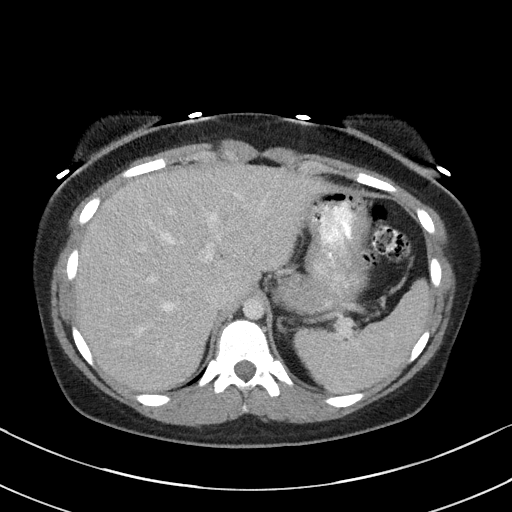
[im 86/90  soft-tissue]
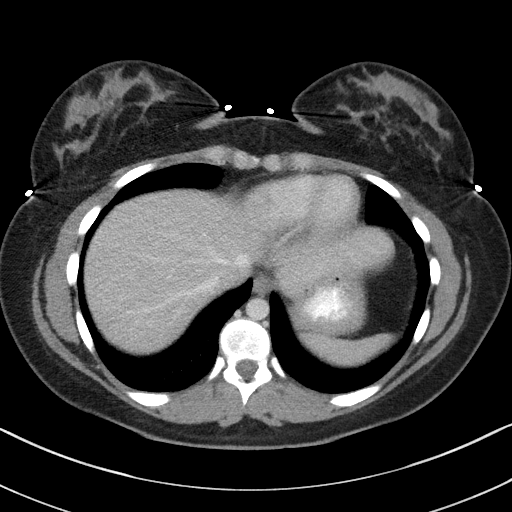

[Series 5: coronal st · coronal · 0.68mm/px · 3 of 101 slices shown]
[im 34/101  soft-tissue]
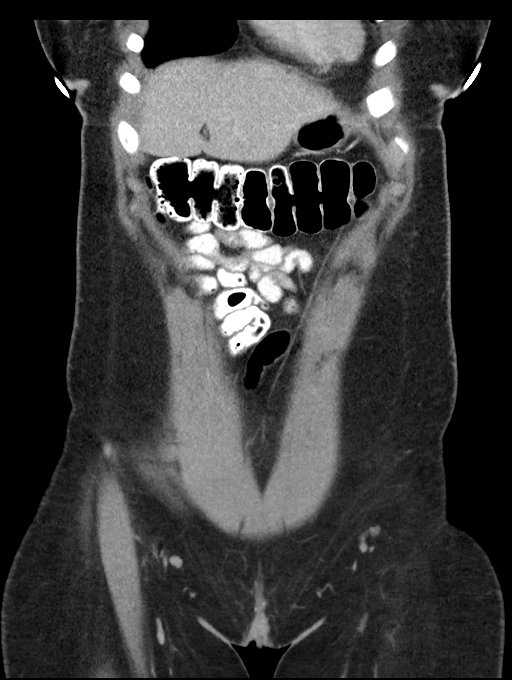
[im 45/101  soft-tissue]
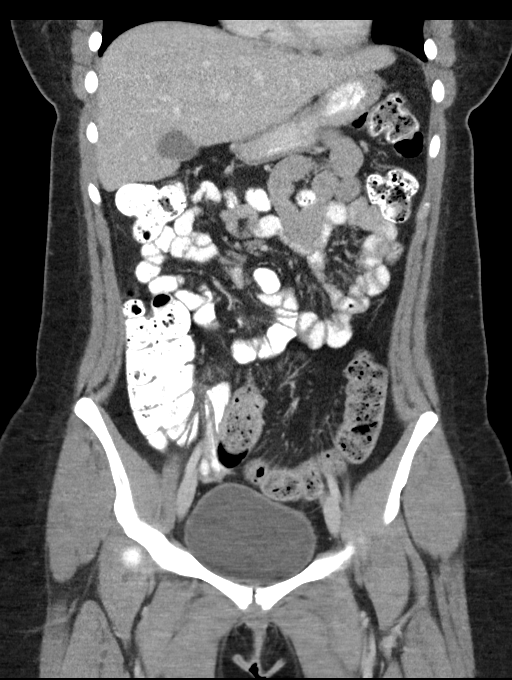
[im 56/101  soft-tissue]
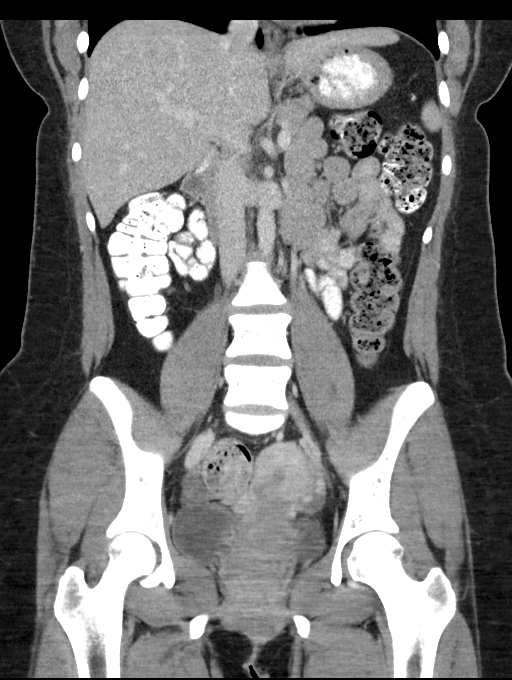

[16 of 46 positions shown; findings below may reference images not displayed]

FINDINGS: Lower chest: No acute abnormality.

Hepatobiliary: No focal liver abnormality. No gallstones,
gallbladder wall thickening, or pericholecystic fluid. No biliary
dilatation.

Pancreas: No focal lesion. Normal pancreatic contour. No surrounding
inflammatory changes. No main pancreatic ductal dilatation.

Spleen: Normal in size without focal abnormality.

Adrenals/Urinary Tract: No adrenal nodule bilaterally. Bilateral
kidneys enhance symmetrically. No hydronephrosis. No hydroureter.
The urinary bladder is unremarkable.

Stomach/Bowel: PO contrast reaches the descending colon. Stomach is
within normal limits. No evidence of bowel wall thickening or
dilatation. Stool throughout the colon. The appendix not definitely
visualized; however, no right lower quadrant inflammatory changes
identified.

Vascular/Lymphatic: No abdominal aorta or iliac aneurysm. No
abdominal, pelvic, or inguinal lymphadenopathy.

Reproductive: Uterus and bilateral adnexa are unremarkable.

Other: No intraperitoneal free fluid. No intraperitoneal free gas.
No organized fluid collection.

Musculoskeletal:

No abdominal wall hernia or abnormality

No suspicious lytic or blastic osseous lesions. No acute displaced
fracture.
IMPRESSION: 1. Constipation.
2. Otherwise no acute intra-abdominal or intrapelvic abnormality.

## 2022-04-18 ENCOUNTER — Other Ambulatory Visit (HOSPITAL_BASED_OUTPATIENT_CLINIC_OR_DEPARTMENT_OTHER): Payer: Self-pay

## 2022-04-18 ENCOUNTER — Encounter: Payer: Self-pay | Admitting: Family

## 2022-04-18 ENCOUNTER — Ambulatory Visit: Payer: Managed Care, Other (non HMO) | Admitting: Family

## 2022-04-18 VITALS — BP 132/82 | HR 115 | Temp 98.1°F | Resp 18 | Ht 64.0 in | Wt 172.0 lb

## 2022-04-18 DIAGNOSIS — R051 Acute cough: Secondary | ICD-10-CM | POA: Diagnosis not present

## 2022-04-18 DIAGNOSIS — J069 Acute upper respiratory infection, unspecified: Secondary | ICD-10-CM

## 2022-04-18 LAB — POCT INFLUENZA A/B
Influenza A, POC: NEGATIVE
Influenza B, POC: NEGATIVE

## 2022-04-18 MED ORDER — FLUTICASONE PROPIONATE 50 MCG/ACT NA SUSP
2.0000 | Freq: Every day | NASAL | 1 refills | Status: DC
  Filled 2022-04-18: qty 16, 30d supply, fill #0

## 2022-04-18 NOTE — Progress Notes (Signed)
  Alice Gonzalez is a 21 y.o. female with the following history as recorded in EpicCare:  There are no problems to display for this patient.   Current Outpatient Medications  Medication Sig Dispense Refill   fluticasone (FLONASE) 50 MCG/ACT nasal spray Place 2 sprays into both nostrils daily. 16 g 1   No current facility-administered medications for this visit.    Allergies: Patient has no known allergies.  No past medical history on file.  Past Surgical History:  Procedure Laterality Date   NO PAST SURGERIES      Family History  Problem Relation Age of Onset   Hypertension Maternal Grandmother    Diabetes Maternal Grandmother    High Cholesterol Maternal Grandmother    Hypertension Maternal Grandfather    Diabetes Maternal Grandfather    High Cholesterol Maternal Grandfather     Social History   Tobacco Use   Smoking status: Never   Smokeless tobacco: Never  Substance Use Topics   Alcohol use: No    Alcohol/week: 0.0 standard drinks of alcohol    Subjective:  3 day history of cough/ congestion/ sore throat; negative COVID test; has been running low grade fever- responding to Tylenol; using OTC Mucinex DM as well; works as Social worker;     Objective:  Vitals:   04/18/22 0953  BP: 132/82  Pulse: (!) 115  Resp: 18  Temp: 98.1 F (36.7 C)  TempSrc: Oral  SpO2: 99%  Weight: 172 lb (78 kg)  Height: 5\' 4"  (1.626 m)    General: Well developed, well nourished, in no acute distress  Skin : Warm and dry.  Head: Normocephalic and atraumatic  Eyes: Sclera and conjunctiva clear; pupils round and reactive to light; extraocular movements intact  Ears: External normal; canals clear; tympanic membranes normal  Oropharynx: Pink, supple. No suspicious lesions  Neck: Supple without thyromegaly, adenopathy  Lungs: Respirations unlabored; clear to auscultation bilaterally without wheeze, rales, rhonchi  CVS exam: normal rate and regular rhythm.  Neurologic: Alert and oriented;  speech intact; face symmetrical; moves all extremities well; CNII-XII intact without focal deficit   Assessment:  1. Viral URI with cough   2. Acute cough     Plan:  Physical exam is reassuring; suspect viral illness- symptomatic treatment discussed; continue Mucinex DM, Tylenol; add Flonase NS; encouraged to remain home from work today and tomorrow; follow up worse, no better- to consider antibiotic if no improvement by the end of the week.   No follow-ups on file.  Orders Placed This Encounter  Procedures   POCT Influenza A/B    Requested Prescriptions   Signed Prescriptions Disp Refills   fluticasone (FLONASE) 50 MCG/ACT nasal spray 16 g 1    Sig: Place 2 sprays into both nostrils daily.

## 2022-04-24 ENCOUNTER — Other Ambulatory Visit: Payer: Self-pay | Admitting: Family

## 2022-04-24 ENCOUNTER — Encounter: Payer: Self-pay | Admitting: Family

## 2022-04-25 ENCOUNTER — Other Ambulatory Visit (HOSPITAL_BASED_OUTPATIENT_CLINIC_OR_DEPARTMENT_OTHER): Payer: Self-pay

## 2022-04-25 ENCOUNTER — Other Ambulatory Visit: Payer: Self-pay | Admitting: Family

## 2022-04-25 MED ORDER — PREDNISONE 20 MG PO TABS
20.0000 mg | ORAL_TABLET | Freq: Every day | ORAL | 0 refills | Status: DC
  Filled 2022-04-25: qty 5, 5d supply, fill #0

## 2022-04-25 MED ORDER — AZITHROMYCIN 250 MG PO TABS
ORAL_TABLET | ORAL | 0 refills | Status: DC
  Filled 2022-04-25: qty 6, 5d supply, fill #0

## 2022-06-26 ENCOUNTER — Ambulatory Visit: Payer: Managed Care, Other (non HMO) | Admitting: Family Medicine

## 2022-06-26 ENCOUNTER — Encounter: Payer: Self-pay | Admitting: Family Medicine

## 2022-06-26 ENCOUNTER — Other Ambulatory Visit (HOSPITAL_BASED_OUTPATIENT_CLINIC_OR_DEPARTMENT_OTHER): Payer: Self-pay

## 2022-06-26 VITALS — BP 114/63 | HR 102 | Temp 98.0°F | Resp 16 | Ht 64.0 in | Wt 176.0 lb

## 2022-06-26 DIAGNOSIS — Z1152 Encounter for screening for COVID-19: Secondary | ICD-10-CM | POA: Diagnosis not present

## 2022-06-26 DIAGNOSIS — R6889 Other general symptoms and signs: Secondary | ICD-10-CM

## 2022-06-26 DIAGNOSIS — J020 Streptococcal pharyngitis: Secondary | ICD-10-CM | POA: Diagnosis not present

## 2022-06-26 DIAGNOSIS — J029 Acute pharyngitis, unspecified: Secondary | ICD-10-CM | POA: Diagnosis not present

## 2022-06-26 DIAGNOSIS — H6121 Impacted cerumen, right ear: Secondary | ICD-10-CM

## 2022-06-26 LAB — POCT RAPID STREP A (OFFICE): Rapid Strep A Screen: POSITIVE — AB

## 2022-06-26 MED ORDER — AMOXICILLIN-POT CLAVULANATE 875-125 MG PO TABS
1.0000 | ORAL_TABLET | Freq: Two times a day (BID) | ORAL | 0 refills | Status: AC
  Filled 2022-06-26: qty 20, 10d supply, fill #0

## 2022-06-26 NOTE — Patient Instructions (Addendum)
Strep test positive.  Flu/COVID negative.  Adding Augmentin given that you have had antibiotics within the past 3 months.  Take with food and consider adding a probiotic 2 hours after antibiotic dose each morning. Continue supportive measures including rest, hydration, humidifier use, steam showers, warm compresses to sinuses, warm liquids with lemon and honey, and over-the-counter cough, cold, and analgesics as needed.   Cerumen impaction: Recommend Debrox drops daily for the next week or so.  If not improving at home you can return to office for irrigation.

## 2022-06-26 NOTE — Progress Notes (Unsigned)
Acute Office Visit  Subjective:     Patient ID: Alice Gonzalez, female    DOB: 2000/11/19, 22 y.o.   MRN: [id number]  Chief Complaint  Patient presents with   Sore Throat   Nasal Congestion   Ear Pain    bilateral     Patient is in today for sore throat.  Patient reports she started noticing a sore throat last Thursday, 4 days ago.  Reports sore throat has gradually worsened and is now 6/10 with bilateral ear pain/pressure, worse on the right side 8/10.  She has also been noticing a few days of fevers, chills, headaches, mild nasal congestion, anterior cervical chain lymphadenopathy.  She denies any chest pain, shortness of breath, nausea, vomiting, diarrhea, urinary changes, rashes.  No known sick contacts.     ROS All review of systems negative except what is listed in the HPI      Objective:    BP 114/63   Pulse (!) 102   Temp 98 F (36.7 C)   Resp 16   Ht 5' 4"$  (1.626 m)   Wt 176 lb (79.8 kg)   SpO2 100%   BMI 30.21 kg/m  {Vitals History (Optional):23777}  Physical Exam Vitals reviewed.  Constitutional:      Appearance: She is well-developed.  HENT:     Head: Normocephalic and atraumatic.     Right Ear: Tenderness present. There is impacted cerumen.     Left Ear: Tympanic membrane normal.     Mouth/Throat:     Pharynx: Uvula midline. Posterior oropharyngeal erythema present.     Tonsils: No tonsillar exudate or tonsillar abscesses. 2+ on the right. 2+ on the left.  Eyes:     Conjunctiva/sclera: Conjunctivae normal.  Cardiovascular:     Rate and Rhythm: Normal rate and regular rhythm.  Pulmonary:     Effort: Pulmonary effort is normal.     Breath sounds: Normal breath sounds.  Abdominal:     General: Bowel sounds are normal.     Palpations: Abdomen is soft.  Lymphadenopathy:     Cervical: Cervical adenopathy present.  Skin:    General: Skin is warm and dry.  Neurological:     General: No focal deficit present.     Mental Status: She is alert and  oriented to person, place, and time.  Psychiatric:        Mood and Affect: Mood normal.        Behavior: Behavior normal.     Results for orders placed or performed in visit on 06/26/22  POCT rapid strep A  Result Value Ref Range   Rapid Strep A Screen Positive (A) Negative        Assessment & Plan:   Problem List Items Addressed This Visit   None Visit Diagnoses     Flu-like symptoms    -  Primary   Relevant Orders   POCT Influenza A/B   Encounter for screening for COVID-19       Relevant Orders   POC COVID-19   Sore throat       Relevant Orders   POCT rapid strep A (Completed)   Strep pharyngitis     Strep test positive.  Flu/COVID negative.  Adding Augmentin given that you have had antibiotics within the past 3 months.  Take with food and consider adding a probiotic 2 hours after antibiotic dose each morning. Continue supportive measures including rest, hydration, humidifier use, steam showers, warm compresses to sinuses, warm liquids with  lemon and honey, and over-the-counter cough, cold, and analgesics as needed.      Impacted cerumen of right ear     Recommend Debrox drops daily for the next week or so.  If not improving at home you can return to office for irrigation.       Meds ordered this encounter  Medications   amoxicillin-clavulanate (AUGMENTIN) 875-125 MG tablet    Sig: Take 1 tablet by mouth 2 (two) times daily.    Dispense:  20 tablet    Refill:  0    Order Specific Question:   Supervising Provider    Answer:   Penni Homans A [[id number]]    Return if symptoms worsen or fail to improve.  Terrilyn Saver, NP

## 2022-06-27 LAB — POC COVID19 BINAXNOW: SARS Coronavirus 2 Ag: NEGATIVE

## 2022-06-27 LAB — POCT INFLUENZA A/B
Influenza A, POC: NEGATIVE
Influenza B, POC: NEGATIVE
